# Patient Record
Sex: Male | Born: 1965 | Race: Black or African American | Hispanic: No | Smoking: Heavy tobacco smoker
Health system: Southern US, Community
[De-identification: ages and names within clinical notes are randomized; demographics above are authoritative.]

## PROBLEM LIST (undated history)

## (undated) ENCOUNTER — Encounter

## (undated) DIAGNOSIS — Z531 Procedure and treatment not carried out because of patient's decision for reasons of belief and group pressure: Secondary | ICD-10-CM

## (undated) DIAGNOSIS — I10 Essential (primary) hypertension: Secondary | ICD-10-CM

## (undated) DIAGNOSIS — W3400XA Accidental discharge from unspecified firearms or gun, initial encounter: Secondary | ICD-10-CM

## (undated) DIAGNOSIS — R42 Dizziness and giddiness: Secondary | ICD-10-CM

## (undated) DIAGNOSIS — IMO0001 Reserved for inherently not codable concepts without codable children: Secondary | ICD-10-CM

## (undated) DIAGNOSIS — E7800 Pure hypercholesterolemia, unspecified: Secondary | ICD-10-CM

## (undated) DIAGNOSIS — E119 Type 2 diabetes mellitus without complications: Secondary | ICD-10-CM

## (undated) HISTORY — PX: OTHER SURGICAL HISTORY: SHX169

---

## 2012-04-12 ENCOUNTER — Emergency Department (INDEPENDENT_AMBULATORY_CARE_PROVIDER_SITE_OTHER)
Admission: EM | Admit: 2012-04-12 | Discharge: 2012-04-12 | Disposition: A | Discharge status: Nursing Home (Includes ICF) | Payer: Self-pay | Source: Home / Self Care | Attending: Family Medicine | Admitting: Family Medicine

## 2012-04-12 ENCOUNTER — Encounter (HOSPITAL_COMMUNITY): Payer: Self-pay | Admitting: *Deleted

## 2012-04-12 ENCOUNTER — Emergency Department (HOSPITAL_COMMUNITY): Payer: Self-pay

## 2012-04-12 ENCOUNTER — Emergency Department (HOSPITAL_COMMUNITY)
Admission: EM | Admit: 2012-04-12 | Discharge: 2012-04-12 | Disposition: A | Discharge status: Home / Self Care | Payer: Self-pay | Attending: Emergency Medicine | Admitting: Emergency Medicine

## 2012-04-12 DIAGNOSIS — N498 Inflammatory disorders of other specified male genital organs: Secondary | ICD-10-CM | POA: Insufficient documentation

## 2012-04-12 DIAGNOSIS — F17200 Nicotine dependence, unspecified, uncomplicated: Secondary | ICD-10-CM

## 2012-04-12 DIAGNOSIS — R1030 Lower abdominal pain, unspecified: Secondary | ICD-10-CM

## 2012-04-12 DIAGNOSIS — R109 Unspecified abdominal pain: Secondary | ICD-10-CM

## 2012-04-12 DIAGNOSIS — Z862 Personal history of diseases of the blood and blood-forming organs and certain disorders involving the immune mechanism: Secondary | ICD-10-CM | POA: Insufficient documentation

## 2012-04-12 DIAGNOSIS — Z720 Tobacco use: Secondary | ICD-10-CM

## 2012-04-12 DIAGNOSIS — Z8639 Personal history of other endocrine, nutritional and metabolic disease: Secondary | ICD-10-CM | POA: Insufficient documentation

## 2012-04-12 DIAGNOSIS — Z87828 Personal history of other (healed) physical injury and trauma: Secondary | ICD-10-CM | POA: Insufficient documentation

## 2012-04-12 DIAGNOSIS — N492 Inflammatory disorders of scrotum: Secondary | ICD-10-CM

## 2012-04-12 DIAGNOSIS — E119 Type 2 diabetes mellitus without complications: Secondary | ICD-10-CM | POA: Insufficient documentation

## 2012-04-12 DIAGNOSIS — N509 Disorder of male genital organs, unspecified: Secondary | ICD-10-CM

## 2012-04-12 DIAGNOSIS — N5082 Scrotal pain: Secondary | ICD-10-CM

## 2012-04-12 DIAGNOSIS — I10 Essential (primary) hypertension: Secondary | ICD-10-CM | POA: Insufficient documentation

## 2012-04-12 HISTORY — DX: Pure hypercholesterolemia, unspecified: E78.00

## 2012-04-12 HISTORY — DX: Type 2 diabetes mellitus without complications: E11.9

## 2012-04-12 HISTORY — DX: Essential (primary) hypertension: I10

## 2012-04-12 HISTORY — DX: Accidental discharge from unspecified firearms or gun, initial encounter: W34.00XA

## 2012-04-12 LAB — GLUCOSE, CAPILLARY: Glucose-Capillary: 72 mg/dL (ref 70–99)

## 2012-04-12 MED ORDER — DIAZEPAM 5 MG PO TABS
5.0000 mg | ORAL_TABLET | Freq: Once | ORAL | Status: AC
  Administered 2012-04-12: 5 mg via ORAL
  Filled 2012-04-12: qty 1

## 2012-04-12 MED ORDER — OXYCODONE-ACETAMINOPHEN 5-325 MG PO TABS
1.0000 | ORAL_TABLET | Freq: Once | ORAL | Status: AC
  Administered 2012-04-12: 1 via ORAL
  Filled 2012-04-12: qty 1

## 2012-04-12 NOTE — ED Notes (Signed)
Patient complains of pain around scrotum after shaving pubic hair x 1 week; swelling and purulent drainage.

## 2012-04-12 NOTE — ED Notes (Addendum)
Pt sent here from [facility] for further eval on abscess to R scrotum.  Concern of the MD at UC is that swelling is inward.  No redness/warmth noted.
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
## 2012-04-12 NOTE — ED Provider Notes (Signed)
History     CSN: [id number]  Arrival date & time 04/12/12  1544   First MD Initiated Contact with Patient 04/12/12 1608     Chief Complaint  Patient presents with  . Groin Pain   The history is provided by the patient. No language interpreter was used.   Pt reports that he has been having an ingrown hair in the scrotum area for the last several days that has been getting worse.  He had been shaving his testicles and had an ingrown hair and now it is very painful.  Pt says that he has no fever or chills.  He denies nausea and vomiting.   He has a history of diabetes mellitus he had been on insulin.  He lost a lot of weight recently and ended up being off all oral and insulin medications for his diabetes.    Past Medical History  Diagnosis Date  . Diabetes mellitus without complication     History reviewed. No pertinent past surgical history.  No family history on file.  History  Substance Use Topics  . Smoking status: Heavy Tobacco Smoker -- 2.00 packs/day  . Smokeless tobacco: Not on file  . Alcohol Use: Yes     Comment: occasional    Review of Systems  Genitourinary: Positive for scrotal swelling and testicular pain.  All other systems reviewed and are negative.    Allergies  Review of patient's allergies indicates no known allergies.  Home Medications  No current outpatient prescriptions on file.  BP 130/75  Pulse 84  Temp(Src) 98.1 F (36.7 C) (Oral)  Resp 16  SpO2 100%  Physical Exam  Vitals reviewed. Constitutional: He is oriented to person, place, and time. He appears well-developed and well-nourished. No distress.  HENT:  Head: Normocephalic and atraumatic.  Eyes: Conjunctivae and EOM are normal. Pupils are equal, round, and reactive to light.  Neck: Normal range of motion. Neck supple.  Cardiovascular: Normal rate, regular rhythm and normal heart sounds.   Pulmonary/Chest: Effort normal and breath sounds normal.  Abdominal: Soft. Bowel sounds are  normal.  Genitourinary: Penis normal.    Right testis shows mass, swelling and tenderness.  Musculoskeletal: Normal range of motion.  Neurological: He is alert and oriented to person, place, and time.  Skin: Skin is warm and dry.  Psychiatric: He has a normal mood and affect. His behavior is normal. Judgment and thought content normal.    ED Course  Procedures (including critical care time)  Labs Reviewed - No data to display No results found.   No diagnosis found.   MDM  IMPRESSION  Diabetes Mellitus  Scrotal Abscess  RECOMMENDATIONS / PLAN The abscess is severely tender to palpation and manipulation.  Because patient is diabetic I am sending patient to the ER for further treatment and evaluation.    FOLLOW UP For postop care after treatments  The patient was given clear instructions to go to ER or return to medical center if symptoms don't improve, worsen or new problems develop.  The patient verbalized understanding.  The patient was told to call to get lab results if they haven't heard anything in the next week.    Results for orders placed during the hospital encounter of 04/12/12  GLUCOSE, CAPILLARY      Result Value Range   Glucose-Capillary 72  70 - 99 mg/dL          Cleora Fleet, MD 04/12/12 2011

## 2012-04-13 NOTE — ED Provider Notes (Signed)
I saw and evaluated the patient, reviewed the resident's note and I agree with the findings and plan. Patient with scrotal abscess that is small in nature and does not appear to involve internal structures. It started after shaving in the area. 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 Ultrasound was negative for underlying involvement and I&D performed at bedside  Gwyneth Sprout, MD 04/13/12 0031

## 2012-04-17 ENCOUNTER — Emergency Department (HOSPITAL_COMMUNITY)
Admission: EM | Admit: 2012-04-17 | Discharge: 2012-04-17 | Disposition: A | Discharge status: Home / Self Care | Payer: Self-pay | Attending: Emergency Medicine | Admitting: Emergency Medicine

## 2012-04-17 ENCOUNTER — Encounter (HOSPITAL_COMMUNITY): Payer: Self-pay | Admitting: *Deleted

## 2012-04-17 DIAGNOSIS — E119 Type 2 diabetes mellitus without complications: Secondary | ICD-10-CM | POA: Insufficient documentation

## 2012-04-17 DIAGNOSIS — N492 Inflammatory disorders of scrotum: Secondary | ICD-10-CM

## 2012-04-17 DIAGNOSIS — Z862 Personal history of diseases of the blood and blood-forming organs and certain disorders involving the immune mechanism: Secondary | ICD-10-CM | POA: Insufficient documentation

## 2012-04-17 DIAGNOSIS — Z79899 Other long term (current) drug therapy: Secondary | ICD-10-CM | POA: Insufficient documentation

## 2012-04-17 DIAGNOSIS — F17200 Nicotine dependence, unspecified, uncomplicated: Secondary | ICD-10-CM | POA: Insufficient documentation

## 2012-04-17 DIAGNOSIS — Z87828 Personal history of other (healed) physical injury and trauma: Secondary | ICD-10-CM | POA: Insufficient documentation

## 2012-04-17 DIAGNOSIS — N498 Inflammatory disorders of other specified male genital organs: Secondary | ICD-10-CM | POA: Insufficient documentation

## 2012-04-17 DIAGNOSIS — Z8639 Personal history of other endocrine, nutritional and metabolic disease: Secondary | ICD-10-CM | POA: Insufficient documentation

## 2012-04-17 DIAGNOSIS — I10 Essential (primary) hypertension: Secondary | ICD-10-CM | POA: Insufficient documentation

## 2012-04-17 MED ORDER — CEPHALEXIN 250 MG/5ML PO SUSR: 250.0000 mg | Freq: Four times a day (QID) | ORAL | Status: AC

## 2012-04-17 MED ORDER — HYDROCODONE-ACETAMINOPHEN 5-325 MG PO TABS
2.0000 | ORAL_TABLET | Freq: Once | ORAL | Status: AC
  Administered 2012-04-17: 2 via ORAL
  Filled 2012-04-17: qty 2

## 2012-04-17 MED ORDER — HYDROCODONE-ACETAMINOPHEN 5-325 MG PO TABS: 1.0000 | ORAL_TABLET | Freq: Four times a day (QID) | ORAL | Status: DC | PRN
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
## 2012-04-17 NOTE — ED Notes (Signed)
Pt discharged.Vital signs stable and GCS 15 

## 2012-04-17 NOTE — ED Notes (Signed)
Pt was tx here for scrotal wall abscess on 3/27 and told to come back for re-check in 4 days.  States wound continues to drain, but denies redness.

## 2012-04-18 NOTE — ED Provider Notes (Signed)
Medical screening examination/treatment/procedure(s) were conducted as a shared visit with non-physician practitioner(s) and myself.  I personally evaluated the patient during the encounter  Recurrent scrotal abscess. No testicular pain, fever, vomiting.  Glynn Octave, MD 04/18/12 0201

## 2016-10-29 ENCOUNTER — Emergency Department: Admit: 2016-10-29 | Discharge: 2016-10-29

## 2016-10-29 ENCOUNTER — Inpatient Hospital Stay: Admit: 2016-10-29 | Discharge: 2016-10-29

## 2016-10-29 DIAGNOSIS — E119 Type 2 diabetes mellitus without complications: Secondary | ICD-10-CM

## 2016-10-29 DIAGNOSIS — Z23 Encounter for immunization: Secondary | ICD-10-CM

## 2016-10-29 DIAGNOSIS — S0990XA Unspecified injury of head, initial encounter: Secondary | ICD-10-CM

## 2016-10-29 DIAGNOSIS — M542 Cervicalgia: Secondary | ICD-10-CM

## 2016-10-29 DIAGNOSIS — S8991XA Unspecified injury of right lower leg, initial encounter: Secondary | ICD-10-CM

## 2016-10-29 DIAGNOSIS — S00211A Abrasion of right eyelid and periocular area, initial encounter: Principal | ICD-10-CM

## 2016-10-29 DIAGNOSIS — F1010 Alcohol abuse, uncomplicated: Secondary | ICD-10-CM

## 2016-10-29 DIAGNOSIS — M25561 Pain in right knee: Secondary | ICD-10-CM

## 2016-10-29 MED ORDER — TETANUS-DIPHTH-ACELL PERTUSSIS 5-2-15.5 LF-MCG/0.5 IM SUSP
.5 mL | Freq: Once | INTRAMUSCULAR | Status: CP
Start: 2016-10-29 — End: ?

## 2016-10-29 NOTE — ED Provider Notes
1341 Physical examination, vital signs normal; abrasion to R eyebrow, covered w band-aid prior to EMS arrival; PERRL; no focal deficits; A&Ox4; head diffusely TTP, TTP over L neck soft tissue, TTP over R knee; no midline neck or back tenderness. CTAB; no MRG, RRR; DTRs intact    [MC]   1342 Will order labs, imaging; BG 111, reassuring  [MC]      ED Course User Index  [MC] Warren Danes Excell Seltzer, MD         MDM   Decide to obtain history from someone other than the patient: {SH ED Lamonte Sakai MDM - OBTAIN ZOXWRUE:45409}    Decide to obtain previous medical records: [facility] ED Lamonte Sakai MDM - PREVIOUS MED REC - NO WJX:91478}    Clinical Lab Test(s): {SH ED Lamonte Sakai MDM ORDERED AND REVIEWED:28124}    Diagnostic Tests (Radiology, EKG): {SH ED Lamonte Sakai MDM ORDERED AND REVIEWED:28124}    Independent Visualization (ED [place], Wet Prep, Other): {SH ED Lamonte Sakai MDM NO YES [id number]:86578}    Discussed patient with NON-ED Provider: {SH ED Lamonte Sakai MDM - ANOTHER PROVIDER:28381}      ED Disposition   ED Disposition: No ED Disposition Set      ED Clinical Impression   ED Clinical Impression:   No Clinical Impression Set      ED Patient Status   Patient Status:   {SH ED [facility] PATIENT STATUS:[phone]}        ED Medical Evaluation Initiated   Medical Evaluation Initiated:  Yes, filed at 10/29/16 1327  by Tawanna Solo, MD

## 2016-10-29 NOTE — ED Provider Notes
No murmur heard.  Pulmonary/Chest: Effort normal and breath sounds normal. No respiratory distress. He has no wheezes. He has no rales. He exhibits no tenderness.   Abdominal: Soft. Bowel sounds are normal. He exhibits no distension. There is no tenderness. There is no rebound and no guarding.   Musculoskeletal: Normal range of motion. He exhibits tenderness. He exhibits no edema or deformity.   Physical examination, vital signs normal; abrasion to R eyebrow, covered w band-aid prior to EMS arrival; PERRL; no focal deficits; A&Ox4; head diffusely TTP, TTP over L neck soft tissue, TTP over R knee; no midline neck or back tenderness. CTAB; no MRG, RRR; DTRs intact     Neurological: He is alert and oriented to person, place, and time. He exhibits normal muscle tone. Coordination normal.   Skin: Skin is warm and dry. No rash noted. He is not diaphoretic. No erythema. No pallor.   Psychiatric: He has a normal mood and affect. His behavior is normal.   Nursing note and vitals reviewed.      Differential DDx: Epidural hematoma, subdural hematoma, SAH, basilar skull fracture, others    Is this an Emergent Medical Condition? Yes - Severe Pain/Acute Onset of Symptons  409.901 FS  641.19 FS  627.732 (16) FS    ED Workup   Procedures    Labs:  -   BASIC METABOLIC PANEL - Abnormal        Result Value Ref Range    Sodium 142  135 - 145 mmol/L    Potassium 3.8  3.3 - 4.6 mmol/L    Chloride 105  101 - 110 mmol/L    CO2 21  21 - 29 mmol/L    Urea Nitrogen 6  6 - 22 mg/dL    Creatinine 0.85  0.67 - 1.17 mg/dL    BUN/Creatinine Ratio 7.1  6.0 - 22.0 (calc)    Glucose 118 (*) 71 - 99 mg/dL    Calcium 7.8 (*) 8.6 - 10.0 mg/dL    Osmolality Calc 281.8      Anion Gap 16  4 - 16 mmol/L    EGFR >59  mL/min/1.73M2    Comment:   Reference range: =>90 ml/min/1.73M2  eGFR estimates are unable to accurately differentiate levels of GFR above 60 ml/min/1.73M2.   ETHYL ALCOHOL - Abnormal     Ethyl Alcohol 314.0 (*) <=10.0 mg/dL

## 2016-10-29 NOTE — ED Notes
Patient to xray

## 2016-10-29 NOTE — ED Provider Notes
Fair        ED Medical Evaluation Initiated   Medical Evaluation Initiated:  Yes, filed at 10/29/16 1327  by Tawanna Solo, MD            Tawanna Solo, MD  Resident  10/29/16 [phone]

## 2016-10-29 NOTE — ED Provider Notes
?   Years of education: N/A     Social History Main Topics   ? Smoking status: Unknown If Ever Smoked   ? Smokeless tobacco: None   ? Alcohol use Yes   ? Drug use: Unknown   ? Sexual activity: Not Asked     Other Topics Concern   ? None     Social History Narrative   ? None       Review of Systems   Constitutional: Negative for fever, chills and decreased responsiveness.   HENT: Negative for hearing loss.    Eyes: Negative for visual disturbance.   Respiratory: Negative for cough and shortness of breath.    Cardiovascular: Negative for chest pain and palpitations.   Gastrointestinal: Negative for nausea, vomiting, abdominal pain, diarrhea and bowel incontinence.   Genitourinary: Negative for penile pain, testicular pain and pelvic pain.   Musculoskeletal: Negative for neck pain.   Neurological: Positive for headaches. Negative for dizziness, focal weakness, syncope, weakness and light-headedness.   Psychiatric/Behavioral: Negative for memory loss.   All other systems reviewed and are negative.      Physical Exam     ED Triage Vitals   BP 10/29/16 1321 122/82   Pulse 10/29/16 1338 89   Resp 10/29/16 1321 12   Temp --    Temp src --    Height --    Weight --    SpO2 10/29/16 1338 97 %   BMI (Calculated) --              Physical Exam   Constitutional: He is oriented to person, place, and time. He appears well-developed and well-nourished. No distress.   HENT:   Head: Normocephalic and atraumatic.   Right Ear: External ear normal.   Left Ear: External ear normal.   TMs normal, no evidence of hemotympanum   Eyes: Pupils are equal, round, and reactive to light. Conjunctivae and EOM are normal. Right eye exhibits no discharge. Left eye exhibits no discharge.   Neck: Normal range of motion. Neck supple. No JVD present. No tracheal deviation present. No thyromegaly present.   Cardiovascular: Normal rate, regular rhythm, normal heart sounds and intact distal pulses.  Exam reveals no gallop and no friction rub.

## 2016-10-29 NOTE — ED Provider Notes
Alcohol g/dL 9.604  g//dL   OSMOLALITY BLOOD - Abnormal     Osmolality 380 (*) 274 - 298 mosm/Kg   CBC AUTODIFF - Abnormal     WBC 8.64  4.5 - 11 x10E3/uL    RBC 5.51  4.50 - 6.30 x10E6/uL    Hemoglobin 17.0  14.0 - 18.0 g/dL    Hematocrit 54.0  98.1 - 54.0 %    MCV 88.6  82.0 - 101.0 fl    MCH 30.9  27.0 - 34.0 pg    MCHC 34.8  31.0 - 36.0 g/dL    RDW 19.1  47.8 - 29.5 %    Platelet Count 310  140 - 440 thou/cu mm    MPV 8.9 (*) 9.5 - 11.5 fl    nRBC % 0.0  0.0 - 1.0 %    Absolute NRBC Count 0.00      Neutrophils % 61.5  34.0 - 73.0 %    Lymphocytes % 25.8  25.0 - 45.0 %    Monocytes % 10.1 (*) 2.0 - 6.0 %    Eosinophils % 2.1  1.0 - 4.0 %    Immature Granulocytes % 0.3  0.0 - 2.0 %    Neutrophils Absolute 5.31  1.80 - 8.70 x10E3/uL    Lymphocytes Absolute 2.23  x10E3/uL    Monocytes Absolute 0.87  x10E3/uL    Eosinophils Absolute 0.18  x10E3/uL    Basophil Absolute 0.02  x10E3/uL    Absolute Immature Granulocytes 0.03 (*) 0 - 0 x10E3/uL    Basophils % 0.2  0 - 1 %   POCT GLUCOSE - Abnormal     Glucose (Meter) 111 (*) 60.0 - 99.0 mg/dL   RAINBOW DRAW   PURPLE TOP TUBE    HOLD RAINBOW SPECIMEN Hold     BLUE TOP TUBE    HOLD RAINBOW SPECIMEN Hold     LIGHT GREEN TOP TUBE    HOLD RAINBOW SPECIMEN Hold     POCT GLUCOSE   CBC AND DIFFERENTIAL         Imaging   XR Knee Right 4  Views         CT Head w/o Con   Preliminary Result      No acute intracranial hemorrhage, mass-effect or territorial infarct.      Soft tissue swelling noted over the right frontotemporal bone without underlying fracture.      Paranasal sinus disease.      XR Chest Single View   Final Result   Impression:      No acute cardiopulmonary disease.      Read By Burr Medico M.D.     Electronically Verified By - Burr Medico M.D.     Released Date Time - 10/29/2016 2:58 PM     Resident -               EKG (Read by ED Provider):  not applicable        ED Course & Re-Evaluation     ED Course as of Oct 30 1706   Sat Oct 29, 2016

## 2016-10-29 NOTE — ED Notes
Patient back to CT.

## 2016-10-29 NOTE — ED Provider Notes
Pulmonary/Chest: Effort normal and breath sounds normal. No respiratory distress. He has no wheezes. He has no rales. He exhibits no tenderness.   Abdominal: Soft. Bowel sounds are normal. He exhibits no distension. There is no tenderness. There is no rebound and no guarding.   Musculoskeletal: Normal range of motion. He exhibits tenderness. He exhibits no edema or deformity.   Physical examination, vital signs normal; abrasion to R eyebrow, covered w band-aid prior to EMS arrival; PERRL; no focal deficits; A&Ox4; head diffusely TTP, TTP over L neck soft tissue, TTP over R knee; no midline neck or back tenderness. CTAB; no MRG, RRR; DTRs intact     Neurological: He is alert and oriented to person, place, and time. He exhibits normal muscle tone. Coordination normal.   Skin: Skin is warm and dry. No rash noted. He is not diaphoretic. No erythema. No pallor.   Psychiatric: He has a normal mood and affect. His behavior is normal.   Nursing note and vitals reviewed.      Differential DDx: Epidural hematoma, subdural hematoma, SAH, basilar skull fracture, others    Is this an Emergent Medical Condition? Yes - Severe Pain/Acute Onset of Symptons  409.901 FS  641.19 FS  627.732 (16) FS    ED Workup   Procedures    Labs:  -   BASIC METABOLIC PANEL - Abnormal        Result Value Ref Range    Sodium 142  135 - 145 mmol/L    Potassium 3.8  3.3 - 4.6 mmol/L    Chloride 105  101 - 110 mmol/L    CO2 21  21 - 29 mmol/L    Urea Nitrogen 6  6 - 22 mg/dL    Creatinine 0.85  0.67 - 1.17 mg/dL    BUN/Creatinine Ratio 7.1  6.0 - 22.0 (calc)    Glucose 118 (*) 71 - 99 mg/dL    Calcium 7.8 (*) 8.6 - 10.0 mg/dL    Osmolality Calc 281.8      Anion Gap 16  4 - 16 mmol/L    EGFR >59  mL/min/1.73M2    Comment:   Reference range: =>90 ml/min/1.73M2  eGFR estimates are unable to accurately differentiate levels of GFR above 60 ml/min/1.73M2.   ETHYL ALCOHOL - Abnormal     Ethyl Alcohol 314.0 (*) <=10.0 mg/dL    Comment: Phoned IT trainer

## 2016-10-29 NOTE — ED Provider Notes
Comment: Phoned Tax inspector    Alcohol g/dL 1.610  g//dL   OSMOLALITY BLOOD - Abnormal     Osmolality 380 (*) 274 - 298 mosm/Kg   CBC AUTODIFF - Abnormal     WBC 8.64  4.5 - 11 x10E3/uL    RBC 5.51  4.50 - 6.30 x10E6/uL    Hemoglobin 17.0  14.0 - 18.0 g/dL    Hematocrit 96.0  45.4 - 54.0 %    MCV 88.6  82.0 - 101.0 fl    MCH 30.9  27.0 - 34.0 pg    MCHC 34.8  31.0 - 36.0 g/dL    RDW 09.8  11.9 - 14.7 %    Platelet Count 310  140 - 440 thou/cu mm    MPV 8.9 (*) 9.5 - 11.5 fl    nRBC % 0.0  0.0 - 1.0 %    Absolute NRBC Count 0.00      Neutrophils % 61.5  34.0 - 73.0 %    Lymphocytes % 25.8  25.0 - 45.0 %    Monocytes % 10.1 (*) 2.0 - 6.0 %    Eosinophils % 2.1  1.0 - 4.0 %    Immature Granulocytes % 0.3  0.0 - 2.0 %    Neutrophils Absolute 5.31  1.80 - 8.70 x10E3/uL    Lymphocytes Absolute 2.23  x10E3/uL    Monocytes Absolute 0.87  x10E3/uL    Eosinophils Absolute 0.18  x10E3/uL    Basophil Absolute 0.02  x10E3/uL    Absolute Immature Granulocytes 0.03 (*) 0 - 0 x10E3/uL    Basophils % 0.2  0 - 1 %   POCT GLUCOSE - Abnormal     Glucose (Meter) 111 (*) 60.0 - 99.0 mg/dL   RAINBOW DRAW   PURPLE TOP TUBE    HOLD RAINBOW SPECIMEN Hold     BLUE TOP TUBE    HOLD RAINBOW SPECIMEN Hold     LIGHT GREEN TOP TUBE    HOLD RAINBOW SPECIMEN Hold     POCT GLUCOSE   CBC AND DIFFERENTIAL         Imaging   XR Knee Right 4  Views   Final Result      CT Head w/o Con   Preliminary Result      No acute intracranial hemorrhage, mass-effect or territorial infarct.      Soft tissue swelling noted over the right frontotemporal bone without underlying fracture.      Paranasal sinus disease.      XR Chest Single View   Final Result   Impression:      No acute cardiopulmonary disease.      Read By Burr Medico M.D.     Electronically Verified By - Burr Medico M.D.     Released Date Time - 10/29/2016 2:58 PM     Resident -               EKG (Read by ED Provider):  not applicable        ED Course & Re-Evaluation
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
## 2016-10-29 NOTE — ED Notes
Patient back from CT.

## 2016-10-29 NOTE — ED Notes
IV removed with catheter intact.  Pressure applied, Time of discharge: 1715  PM., Patient signed out AMA to  Home.  Patient ambulatory to exit with belongings in stable condition.  Patient escorted by his wife. Patient left prior to receiving discharge paperwork.

## 2016-10-29 NOTE — ED Provider Notes
Social History Main Topics   ? Smoking status: Unknown If Ever Smoked   ? Smokeless tobacco: None   ? Alcohol use Yes   ? Drug use: Unknown   ? Sexual activity: Not Asked     Other Topics Concern   ? None     Social History Narrative   ? None       Review of Systems   Constitutional: Negative for fever, chills and decreased responsiveness.   HENT: Negative for hearing loss.    Eyes: Negative for visual disturbance.   Respiratory: Negative for cough and shortness of breath.    Cardiovascular: Negative for chest pain and palpitations.   Gastrointestinal: Negative for nausea, vomiting, abdominal pain, diarrhea and bowel incontinence.   Genitourinary: Negative for penile pain, testicular pain and pelvic pain.   Musculoskeletal: Negative for neck pain.   Neurological: Positive for headaches. Negative for dizziness, focal weakness, syncope, weakness and light-headedness.   Psychiatric/Behavioral: Negative for memory loss.   All other systems reviewed and are negative.      Physical Exam     ED Triage Vitals   BP 10/29/16 1321 122/82   Pulse 10/29/16 1338 89   Resp 10/29/16 1321 12   Temp --    Temp src --    Height --    Weight --    SpO2 10/29/16 1338 97 %   BMI (Calculated) --              Physical Exam   Constitutional: He is oriented to person, place, and time. He appears well-developed and well-nourished. No distress.   HENT:   Head: Normocephalic and atraumatic.   Right Ear: External ear normal.   Left Ear: External ear normal.   TMs normal, no evidence of hemotympanum   Eyes: Pupils are equal, round, and reactive to light. Conjunctivae and EOM are normal. Right eye exhibits no discharge. Left eye exhibits no discharge.   Neck: Normal range of motion. Neck supple. No JVD present. No tracheal deviation present. No thyromegaly present.   Cardiovascular: Normal rate, regular rhythm, normal heart sounds and intact distal pulses.  Exam reveals no gallop and no friction rub.    No murmur heard.
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
## 2016-10-29 NOTE — ED Notes
MD at bedside.
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
## 2016-10-29 NOTE — ED Notes
Patient back from xray. Patient and his wife are aware that we are waiting on test results at this time and they have no unanswered questions.

## 2016-10-29 NOTE — ED Notes
Patient's wife signed him out Against Medical Advice.
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
## 2016-10-29 NOTE — ED Provider Notes
Medical Evaluation Initiated:  Yes, filed at 10/29/16 1327  by Tawanna Solo, MD

## 2016-10-29 NOTE — ED Notes
Patient's wife arrived at bedside.
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
## 2016-10-29 NOTE — ED Notes
Patient requested to see MD for update on plan of care. MD notified.

## 2016-10-29 NOTE — ED Provider Notes
Social History Main Topics   ? Smoking status: Unknown If Ever Smoked   ? Smokeless tobacco: None   ? Alcohol use Yes   ? Drug use: Unknown   ? Sexual activity: Not Asked     Other Topics Concern   ? None     Social History Narrative   ? None       Review of Systems   Constitutional: Negative for fever, chills and decreased responsiveness.   HENT: Negative for hearing loss.    Eyes: Negative for visual disturbance.   Respiratory: Negative for cough and shortness of breath.    Cardiovascular: Negative for chest pain and palpitations.   Gastrointestinal: Negative for nausea, vomiting, abdominal pain, diarrhea and bowel incontinence.   Genitourinary: Negative for penile pain, testicular pain and pelvic pain.   Musculoskeletal: Negative for neck pain.   Neurological: Positive for headaches. Negative for dizziness, focal weakness, syncope, weakness and light-headedness.   Psychiatric/Behavioral: Negative for memory loss.   All other systems reviewed and are negative.      Physical Exam     ED Triage Vitals [10/29/16 1321]   BP 122/82   Pulse    Resp 12   Temp    Temp src    Height    Weight    SpO2    BMI (Calculated)              Physical Exam   Constitutional: He is oriented to person, place, and time. He appears well-developed and well-nourished. No distress.   HENT:   Head: Normocephalic and atraumatic.   Right Ear: External ear normal.   Left Ear: External ear normal.   TMs normal, no evidence of hemotympanum   Eyes: Pupils are equal, round, and reactive to light. Conjunctivae and EOM are normal. Right eye exhibits no discharge. Left eye exhibits no discharge.   Neck: Normal range of motion. Neck supple. No JVD present. No tracheal deviation present. No thyromegaly present.   Cardiovascular: Normal rate, regular rhythm, normal heart sounds and intact distal pulses.  Exam reveals no gallop and no friction rub.    No murmur heard.  Pulmonary/Chest: Effort normal and breath sounds normal. No respiratory
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
## 2016-10-29 NOTE — ED Notes
Patient to CT
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
## 2016-10-29 NOTE — ED Notes
Patient back from CT

## 2017-02-09 ENCOUNTER — Emergency Department (HOSPITAL_COMMUNITY)
Admission: EM | Admit: 2017-02-09 | Discharge: 2017-02-10 | Disposition: A | Discharge status: Home / Self Care | Payer: Self-pay | Attending: Emergency Medicine | Admitting: Emergency Medicine

## 2017-02-09 ENCOUNTER — Other Ambulatory Visit: Payer: Self-pay

## 2017-02-09 ENCOUNTER — Encounter (HOSPITAL_COMMUNITY): Payer: Self-pay

## 2017-02-09 DIAGNOSIS — Z5321 Procedure and treatment not carried out due to patient leaving prior to being seen by health care provider: Secondary | ICD-10-CM | POA: Insufficient documentation

## 2017-02-09 DIAGNOSIS — R1110 Vomiting, unspecified: Secondary | ICD-10-CM | POA: Insufficient documentation

## 2017-02-09 HISTORY — DX: Reserved for inherently not codable concepts without codable children: IMO0001

## 2017-02-09 HISTORY — DX: Procedure and treatment not carried out because of patient's decision for reasons of belief and group pressure: Z53.1

## 2017-02-09 LAB — COMPREHENSIVE METABOLIC PANEL
ALK PHOS: 66 U/L (ref 38–126)
ALT: 40 U/L (ref 17–63)
AST: 41 U/L (ref 15–41)
Albumin: 4 g/dL (ref 3.5–5.0)
Anion gap: 13 (ref 5–15)
BILIRUBIN TOTAL: 0.6 mg/dL (ref 0.3–1.2)
BUN: 10 mg/dL (ref 6–20)
CO2: 23 mmol/L (ref 22–32)
CREATININE: 0.85 mg/dL (ref 0.61–1.24)
Calcium: 9.1 mg/dL (ref 8.9–10.3)
Chloride: 101 mmol/L (ref 101–111)
GFR calc Af Amer: >60 mL/min (ref >60)
Glucose, Bld: 115 mg/dL — ABNORMAL HIGH (ref 65–99)
Potassium: 3.7 mmol/L (ref 3.5–5.1)
Sodium: 137 mmol/L (ref 135–145)
TOTAL PROTEIN: 7.4 g/dL (ref 6.5–8.1)

## 2017-02-09 LAB — URINALYSIS, ROUTINE W REFLEX MICROSCOPIC
Bilirubin Urine: NEGATIVE
Glucose, UA: NEGATIVE mg/dL
HGB URINE DIPSTICK: NEGATIVE
Ketones, ur: NEGATIVE mg/dL
NITRITE: NEGATIVE
PROTEIN: 30 mg/dL — AB
Specific Gravity, Urine: 1.021 (ref 1.005–1.030)
pH: 9 — ABNORMAL HIGH (ref 5.0–8.0)

## 2017-02-09 LAB — LIPASE, BLOOD: Lipase: 32 U/L (ref 11–51)

## 2017-02-09 LAB — CBC
HEMATOCRIT: 43.7 % (ref 39.0–52.0)
Hemoglobin: 15.1 g/dL (ref 13.0–17.0)
MCH: 30.8 pg (ref 26.0–34.0)
MCHC: 34.6 g/dL (ref 30.0–36.0)
MCV: 89 fL (ref 78.0–100.0)
Platelets: 321 10*3/uL (ref 150–400)
RBC: 4.91 MIL/uL (ref 4.22–5.81)
RDW: 13.9 % (ref 11.5–15.5)
WBC: 7.2 10*3/uL (ref 4.0–10.5)

## 2017-02-09 NOTE — ED Triage Notes (Signed)
Pt states he has generalized body aches, headache, vomiting X3 days. Pt alert and oriented, skin warm and dry. No distress noted.

## 2017-02-09 NOTE — ED Notes (Signed)
Lab work, radiology results and vital signs reviewed, no critical results at this time, no change in acuity indicated.  

## 2017-02-10 NOTE — ED Notes (Signed)
Follow-up call completed.  Pt. s wife state, "He is feeling better today."  Very grateful for the follow-up

## 2017-02-11 ENCOUNTER — Other Ambulatory Visit: Payer: Self-pay

## 2017-02-11 ENCOUNTER — Emergency Department (HOSPITAL_COMMUNITY)
Admission: EM | Admit: 2017-02-11 | Discharge: 2017-02-11 | Discharge status: Against Medical Advice | Payer: Self-pay | Attending: Emergency Medicine | Admitting: Emergency Medicine

## 2017-02-11 ENCOUNTER — Encounter (HOSPITAL_COMMUNITY): Payer: Self-pay | Admitting: Emergency Medicine

## 2017-02-11 DIAGNOSIS — R51 Headache: Secondary | ICD-10-CM | POA: Insufficient documentation

## 2017-02-11 DIAGNOSIS — Z5321 Procedure and treatment not carried out due to patient leaving prior to being seen by health care provider: Secondary | ICD-10-CM | POA: Insufficient documentation

## 2017-02-11 NOTE — ED Notes (Signed)
Patient called x 2 with no answer 

## 2017-02-11 NOTE — ED Notes (Signed)
Called 3X -no response

## 2017-02-11 NOTE — ED Triage Notes (Signed)
Pt states he was seen in ED yesterday and left post-triage due to wait.  Reports generalized body aches, headache, vomiting, and dizziness.  States his wife was admitted 3 days last week due to flu.

## 2017-03-07 ENCOUNTER — Encounter (HOSPITAL_COMMUNITY): Payer: Self-pay | Admitting: Emergency Medicine

## 2017-03-07 ENCOUNTER — Emergency Department (HOSPITAL_COMMUNITY): Payer: Self-pay

## 2017-03-07 ENCOUNTER — Other Ambulatory Visit: Payer: Self-pay

## 2017-03-07 ENCOUNTER — Emergency Department (HOSPITAL_COMMUNITY)
Admission: EM | Admit: 2017-03-07 | Discharge: 2017-03-07 | Disposition: A | Discharge status: Home / Self Care | Payer: Self-pay | Attending: Emergency Medicine | Admitting: Emergency Medicine

## 2017-03-07 DIAGNOSIS — R05 Cough: Secondary | ICD-10-CM | POA: Insufficient documentation

## 2017-03-07 DIAGNOSIS — Z5321 Procedure and treatment not carried out due to patient leaving prior to being seen by health care provider: Secondary | ICD-10-CM | POA: Insufficient documentation

## 2017-03-07 DIAGNOSIS — R42 Dizziness and giddiness: Secondary | ICD-10-CM | POA: Insufficient documentation

## 2017-03-07 DIAGNOSIS — R51 Headache: Secondary | ICD-10-CM | POA: Insufficient documentation

## 2017-03-07 NOTE — ED Notes (Signed)
Called pt for vitals no answer 
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
## 2017-03-07 NOTE — ED Notes (Signed)
Called pt no answer °

## 2017-03-10 ENCOUNTER — Encounter (HOSPITAL_COMMUNITY): Payer: Self-pay

## 2017-03-10 ENCOUNTER — Emergency Department (HOSPITAL_COMMUNITY)
Admission: EM | Admit: 2017-03-10 | Discharge: 2017-03-10 | Disposition: A | Discharge status: Home / Self Care | Payer: Self-pay | Attending: Emergency Medicine | Admitting: Emergency Medicine

## 2017-03-10 DIAGNOSIS — E119 Type 2 diabetes mellitus without complications: Secondary | ICD-10-CM | POA: Insufficient documentation

## 2017-03-10 DIAGNOSIS — Z79899 Other long term (current) drug therapy: Secondary | ICD-10-CM | POA: Insufficient documentation

## 2017-03-10 DIAGNOSIS — J111 Influenza due to unidentified influenza virus with other respiratory manifestations: Secondary | ICD-10-CM | POA: Insufficient documentation

## 2017-03-10 DIAGNOSIS — I10 Essential (primary) hypertension: Secondary | ICD-10-CM | POA: Insufficient documentation

## 2017-03-10 DIAGNOSIS — R69 Illness, unspecified: Secondary | ICD-10-CM

## 2017-03-10 DIAGNOSIS — F17200 Nicotine dependence, unspecified, uncomplicated: Secondary | ICD-10-CM | POA: Insufficient documentation

## 2017-03-10 HISTORY — DX: Dizziness and giddiness: R42

## 2017-03-10 LAB — INFLUENZA PANEL BY PCR (TYPE A & B)
INFLAPCR: POSITIVE — AB
Influenza B By PCR: NEGATIVE

## 2017-03-10 MED ORDER — IBUPROFEN 800 MG PO TABS
800.0000 mg | ORAL_TABLET | Freq: Once | ORAL | Status: AC
  Administered 2017-03-10: 800 mg via ORAL
  Filled 2017-03-10: qty 1

## 2017-03-10 MED ORDER — OSELTAMIVIR PHOSPHATE 75 MG PO CAPS: 75.0000 mg | ORAL_CAPSULE | Freq: Two times a day (BID) | ORAL | 0 refills | Status: DC

## 2017-03-10 MED ORDER — IBUPROFEN 600 MG PO TABS: 600.0000 mg | ORAL_TABLET | Freq: Four times a day (QID) | ORAL | 0 refills | Status: DC | PRN

## 2017-03-10 MED ORDER — OSELTAMIVIR PHOSPHATE 75 MG PO CAPS
75.0000 mg | ORAL_CAPSULE | Freq: Once | ORAL | Status: AC
  Administered 2017-03-10: 75 mg via ORAL
  Filled 2017-03-10: qty 1
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
## 2017-03-12 ENCOUNTER — Emergency Department (HOSPITAL_COMMUNITY)
Admission: EM | Admit: 2017-03-12 | Discharge: 2017-03-12 | Disposition: A | Discharge status: Home / Self Care | Payer: Self-pay | Attending: Emergency Medicine | Admitting: Emergency Medicine

## 2017-03-12 ENCOUNTER — Encounter (HOSPITAL_COMMUNITY): Payer: Self-pay | Admitting: Student

## 2017-03-12 DIAGNOSIS — E119 Type 2 diabetes mellitus without complications: Secondary | ICD-10-CM | POA: Insufficient documentation

## 2017-03-12 DIAGNOSIS — I10 Essential (primary) hypertension: Secondary | ICD-10-CM | POA: Insufficient documentation

## 2017-03-12 DIAGNOSIS — R42 Dizziness and giddiness: Secondary | ICD-10-CM | POA: Insufficient documentation

## 2017-03-12 DIAGNOSIS — Z79899 Other long term (current) drug therapy: Secondary | ICD-10-CM | POA: Insufficient documentation

## 2017-03-12 DIAGNOSIS — F17200 Nicotine dependence, unspecified, uncomplicated: Secondary | ICD-10-CM | POA: Insufficient documentation

## 2017-03-12 MED ORDER — MECLIZINE HCL 25 MG PO TABS: 25.0000 mg | ORAL_TABLET | Freq: Three times a day (TID) | ORAL | 0 refills | Status: DC | PRN

## 2017-03-12 NOTE — Discharge Instructions (Signed)
You were seen in the emergency department today for your vertigo.  Your physical exam was normal.  We have given you a refill of meclizine, you may take this once every 8 hours as needed for dizziness.  We have also given you a handout for the Epley maneuver, you may perform this when you are having your spells of dizziness.  We recommend that you follow-up with a primary care provider in the next week for reevaluation and further management of your symptoms.  If you do not have a primary care provider you may follow-up with our [place] [facility] or you may call the phone number in your discharge instructions to set up primary care.  Return to the emergency department for any new or worsening symptoms or any other concerns you may have.

## 2017-03-12 NOTE — ED Triage Notes (Signed)
Pt to ER for check up after being diagnosed with flu Friday, states feels 100% better but wants a check up.  Also states he is experiencing his vertigo intermittently over the last 4 months, states only happens when he leans to the left. Pt denies having vertigo at this time. Pt has no complaints.
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
## 2019-05-23 ENCOUNTER — Emergency Department: Admit: 2019-05-23 | Discharge: 2019-05-23

## 2019-05-23 DIAGNOSIS — M5431 Sciatica, right side: Principal | ICD-10-CM

## 2019-05-23 MED ORDER — MELOXICAM 7.5 MG PO TABS
7.5 mg | Freq: Every day | ORAL | 0 refills | Status: CP
Start: 2019-05-23 — End: ?

## 2019-05-23 MED ORDER — KETOROLAC TROMETHAMINE 30 MG/ML IJ SOLN
60 mg | Freq: Once | INTRAMUSCULAR | Status: CP
Start: 2019-05-23 — End: ?

## 2019-05-23 MED ORDER — GABAPENTIN 300 MG PO CAPS
300 mg | Freq: Three times a day (TID) | ORAL | 0 refills | 30.00000 days | Status: CP
Start: 2019-05-23 — End: ?

## 2019-08-12 IMAGING — DX DG CHEST 2V
2 series · 2 of 2 positions shown · non-contrast
Comparison: None.

CLINICAL DATA: Cough and shortness of breath. Cough for months,
worsening over the past 2 weeks. Smoker.

EXAM:
CHEST  2 VIEW

[chest pa]
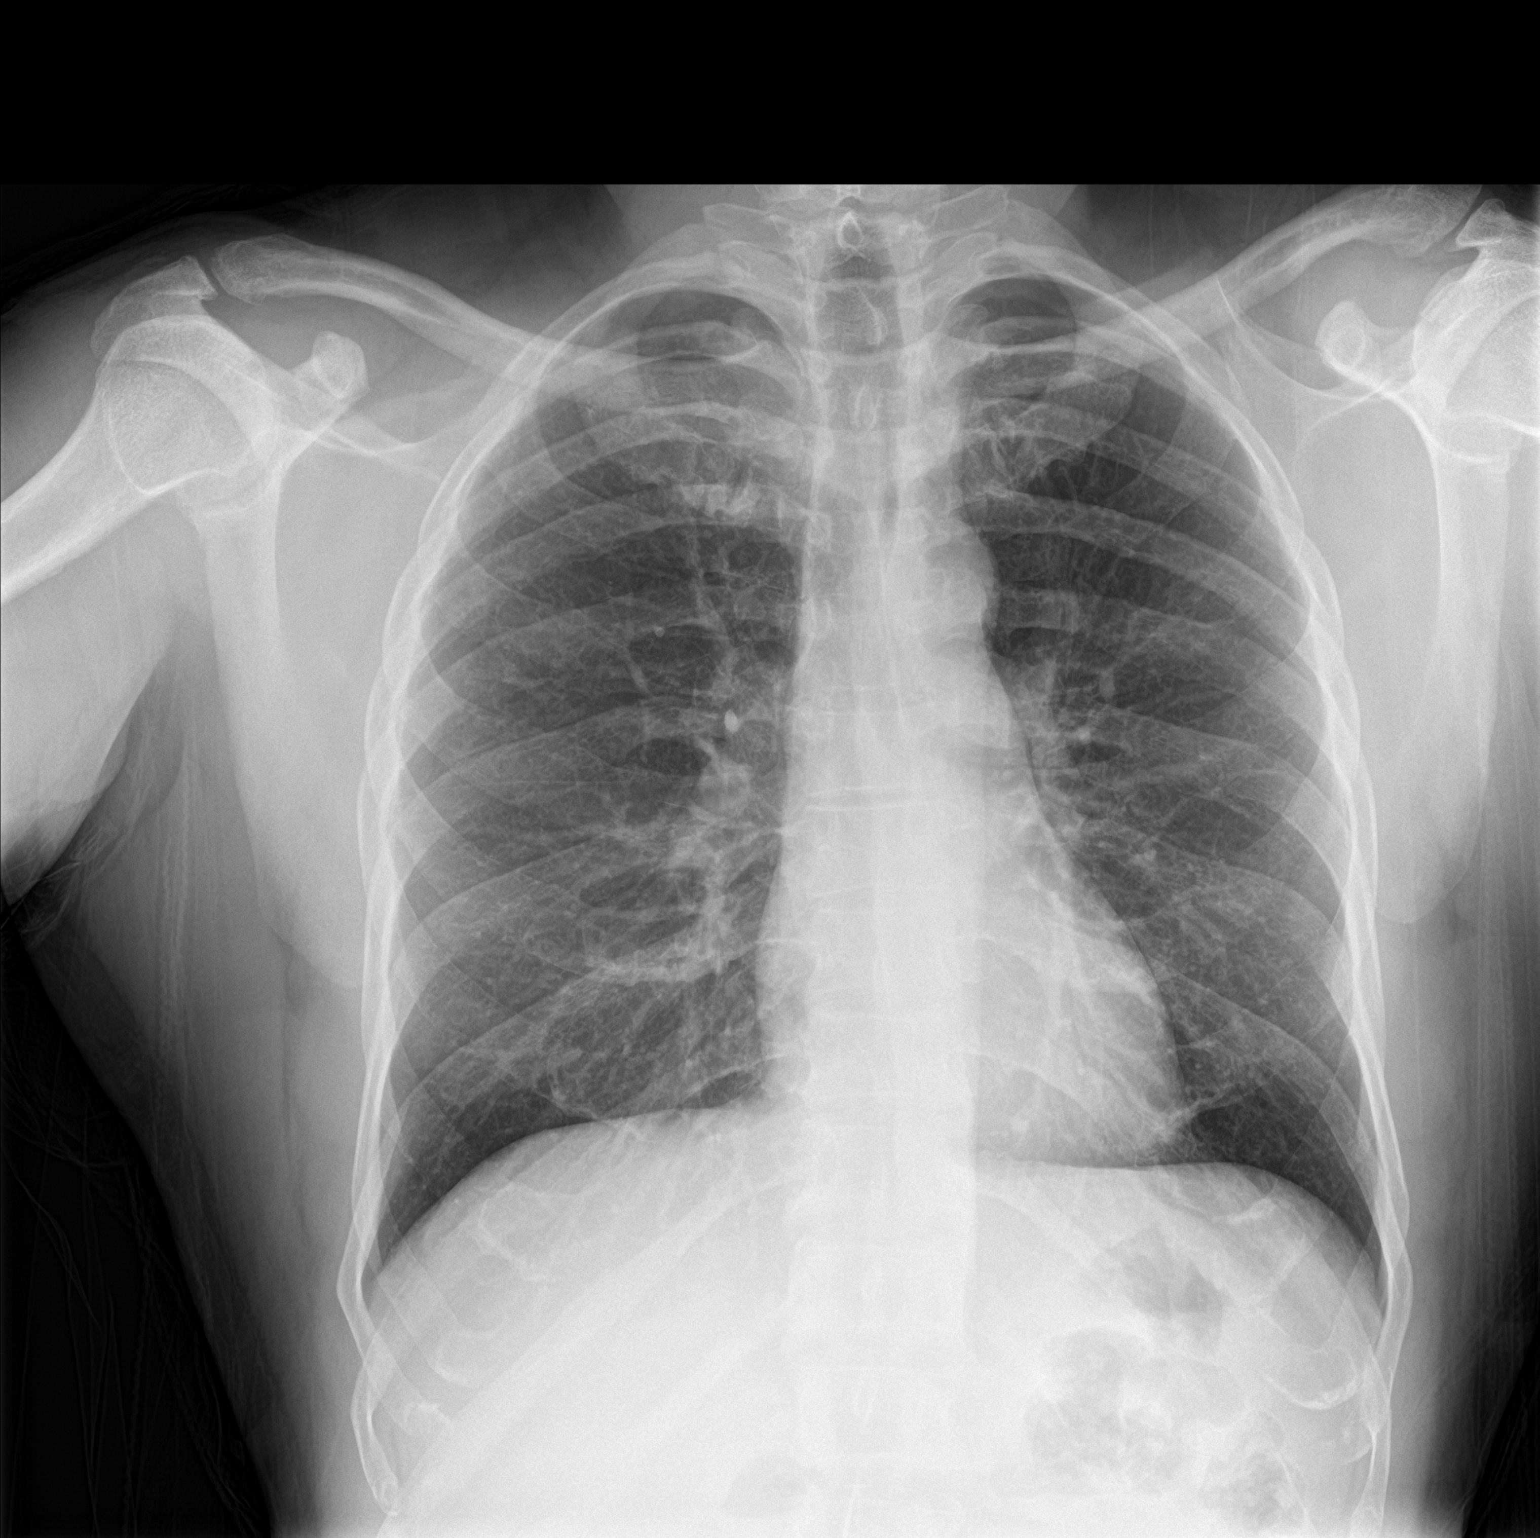

[chest lat]
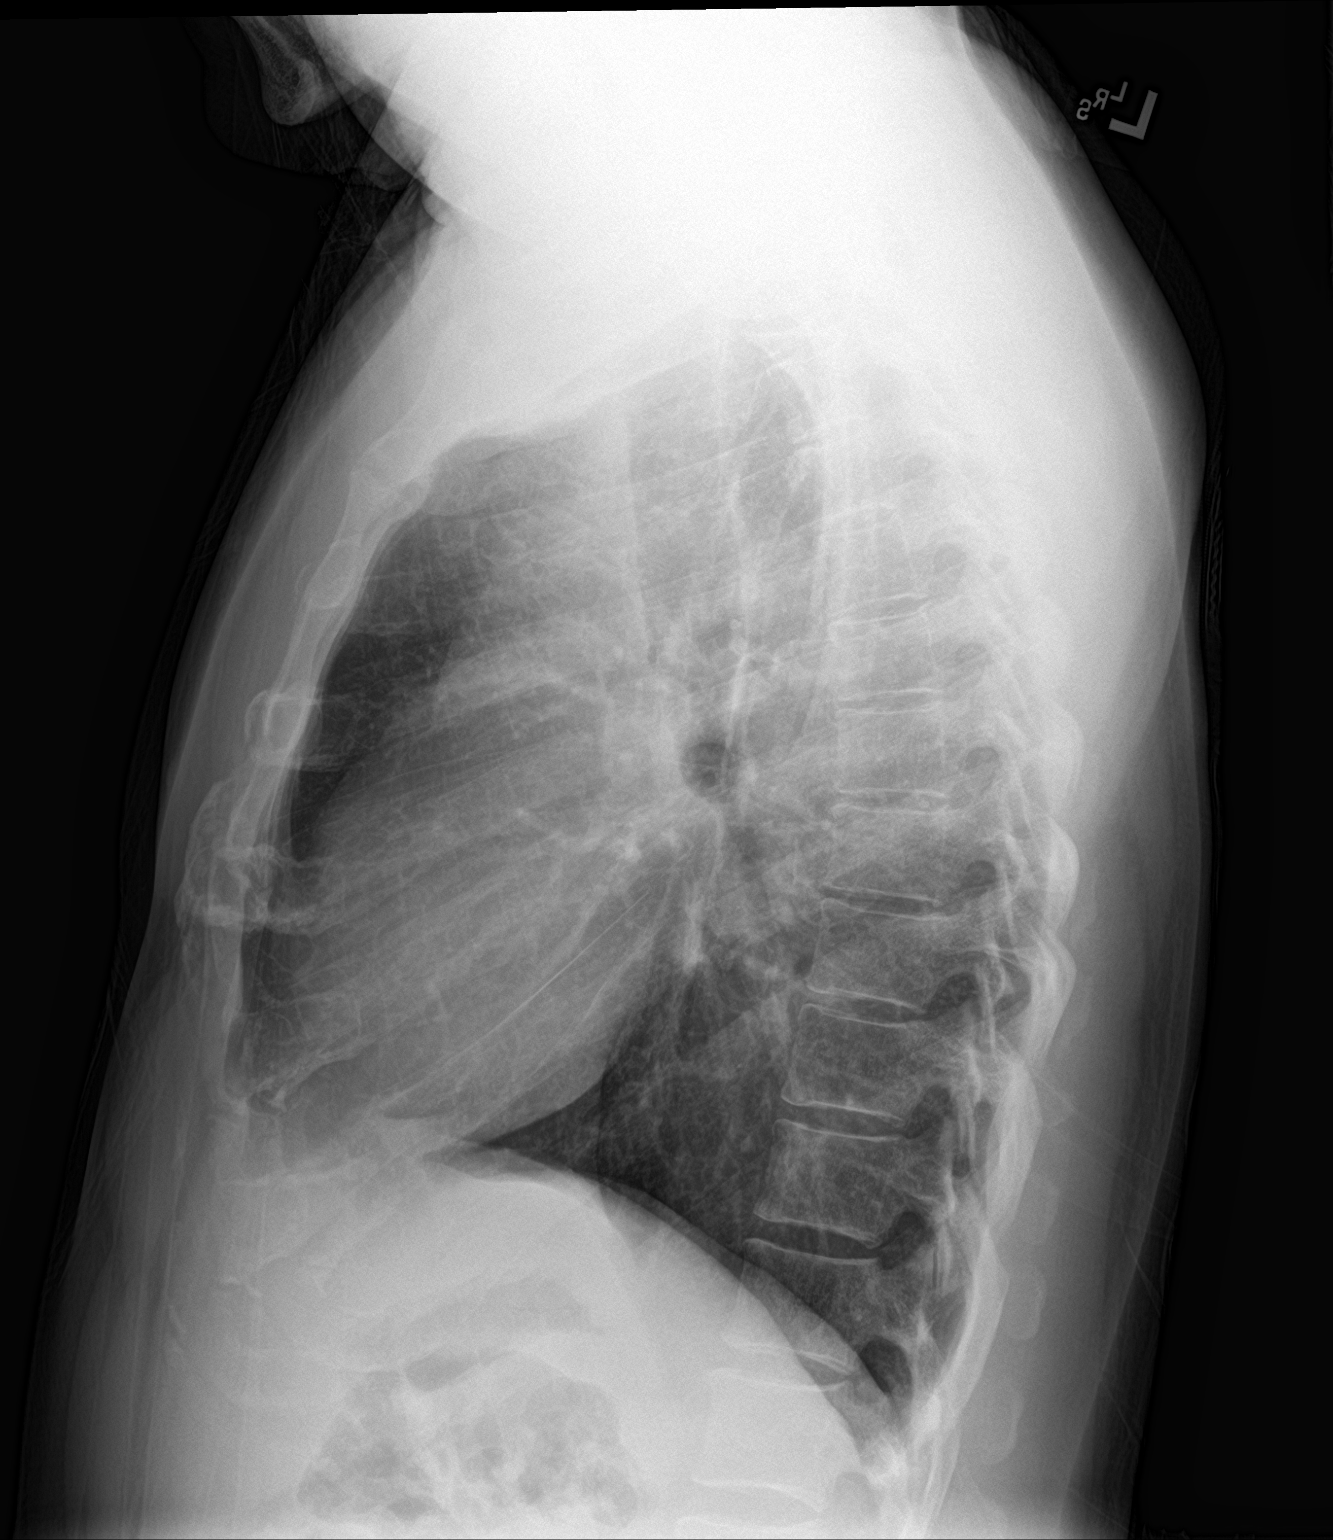

[2 of 2 positions shown; findings below may reference images not displayed]

FINDINGS: The cardiomediastinal contours are normal. Borderline hyperinflation
with mild bronchitic change. Pulmonary vasculature is normal. No
consolidation, pleural effusion, or pneumothorax. No acute osseous
abnormalities are seen.
IMPRESSION: Mild bronchitic change with borderline hyperinflation, favor smoking
related lung disease. Asthma or bronchitis could have a similar
appearance.

## 2019-12-10 ENCOUNTER — Encounter (HOSPITAL_COMMUNITY): Payer: Self-pay | Admitting: Emergency Medicine

## 2019-12-10 ENCOUNTER — Emergency Department (HOSPITAL_COMMUNITY)
Admission: EM | Admit: 2019-12-10 | Discharge: 2019-12-11 | Disposition: A | Discharge status: Home / Self Care | Payer: Self-pay | Attending: Emergency Medicine | Admitting: Emergency Medicine

## 2019-12-10 ENCOUNTER — Other Ambulatory Visit: Payer: Self-pay

## 2019-12-10 DIAGNOSIS — R112 Nausea with vomiting, unspecified: Secondary | ICD-10-CM | POA: Insufficient documentation

## 2019-12-10 DIAGNOSIS — I10 Essential (primary) hypertension: Secondary | ICD-10-CM | POA: Insufficient documentation

## 2019-12-10 DIAGNOSIS — Z79899 Other long term (current) drug therapy: Secondary | ICD-10-CM | POA: Insufficient documentation

## 2019-12-10 DIAGNOSIS — M549 Dorsalgia, unspecified: Secondary | ICD-10-CM | POA: Insufficient documentation

## 2019-12-10 DIAGNOSIS — Z20822 Contact with and (suspected) exposure to covid-19: Secondary | ICD-10-CM | POA: Insufficient documentation

## 2019-12-10 DIAGNOSIS — F17210 Nicotine dependence, cigarettes, uncomplicated: Secondary | ICD-10-CM | POA: Insufficient documentation

## 2019-12-10 DIAGNOSIS — E119 Type 2 diabetes mellitus without complications: Secondary | ICD-10-CM | POA: Insufficient documentation

## 2019-12-10 LAB — CBC WITH DIFFERENTIAL/PLATELET
Abs Immature Granulocytes: 0.01 10*3/uL (ref 0.00–0.07)
Basophils Absolute: 0 10*3/uL (ref 0.0–0.1)
Basophils Relative: 0 %
Eosinophils Absolute: 0.2 10*3/uL (ref 0.0–0.5)
Eosinophils Relative: 4 %
HCT: 41.8 % (ref 39.0–52.0)
Hemoglobin: 14.3 g/dL (ref 13.0–17.0)
Immature Granulocytes: 0 %
Lymphocytes Relative: 25 %
Lymphs Abs: 1.3 10*3/uL (ref 0.7–4.0)
MCH: 31.3 pg (ref 26.0–34.0)
MCHC: 34.2 g/dL (ref 30.0–36.0)
MCV: 91.5 fL (ref 80.0–100.0)
Monocytes Absolute: 0.6 10*3/uL (ref 0.1–1.0)
Monocytes Relative: 11 %
Neutro Abs: 3.1 10*3/uL (ref 1.7–7.7)
Neutrophils Relative %: 60 %
Platelets: 300 10*3/uL (ref 150–400)
RBC: 4.57 MIL/uL (ref 4.22–5.81)
RDW: 15.1 % (ref 11.5–15.5)
WBC: 5.2 10*3/uL (ref 4.0–10.5)
nRBC: 0 % (ref 0.0–0.2)

## 2019-12-10 LAB — LIPASE, BLOOD: Lipase: 32 U/L (ref 11–51)

## 2019-12-10 LAB — URINALYSIS, ROUTINE W REFLEX MICROSCOPIC
Bilirubin Urine: NEGATIVE
Glucose, UA: 50 mg/dL — AB
Hgb urine dipstick: NEGATIVE
Ketones, ur: 20 mg/dL — AB
Nitrite: NEGATIVE
Protein, ur: 30 mg/dL — AB
Specific Gravity, Urine: 1.025 (ref 1.005–1.030)
pH: 6 (ref 5.0–8.0)

## 2019-12-10 LAB — RESPIRATORY PANEL BY RT PCR (FLU A&B, COVID)
Influenza A by PCR: NEGATIVE
Influenza B by PCR: NEGATIVE
SARS Coronavirus 2 by RT PCR: NEGATIVE

## 2019-12-10 LAB — COMPREHENSIVE METABOLIC PANEL
ALT: 67 U/L — ABNORMAL HIGH (ref 0–44)
AST: 73 U/L — ABNORMAL HIGH (ref 15–41)
Albumin: 3.8 g/dL (ref 3.5–5.0)
Alkaline Phosphatase: 58 U/L (ref 38–126)
Anion gap: 10 (ref 5–15)
BUN: 14 mg/dL (ref 6–20)
CO2: 24 mmol/L (ref 22–32)
Calcium: 9.1 mg/dL (ref 8.9–10.3)
Chloride: 100 mmol/L (ref 98–111)
Creatinine, Ser: 1.02 mg/dL (ref 0.61–1.24)
GFR, Estimated: >60 mL/min (ref >60)
Glucose, Bld: 137 mg/dL — ABNORMAL HIGH (ref 70–99)
Potassium: 3.6 mmol/L (ref 3.5–5.1)
Sodium: 134 mmol/L — ABNORMAL LOW (ref 135–145)
Total Bilirubin: 1.2 mg/dL (ref 0.3–1.2)
Total Protein: 6.5 g/dL (ref 6.5–8.1)

## 2019-12-10 MED ORDER — ONDANSETRON HCL 4 MG/2ML IJ SOLN
4.0000 mg | Freq: Once | INTRAMUSCULAR | Status: AC
  Administered 2019-12-10: 4 mg via INTRAVENOUS
  Filled 2019-12-10: qty 2

## 2019-12-10 MED ORDER — LACTATED RINGERS IV BOLUS
1000.0000 mL | Freq: Once | INTRAVENOUS | Status: AC
  Administered 2019-12-10: 1000 mL via INTRAVENOUS

## 2019-12-10 NOTE — ED Notes (Signed)
Pt given a cup of ginger ale for Po challenge
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
## 2019-12-10 NOTE — ED Triage Notes (Signed)
Patient reports headache with right low back pain for 2 days , denies injury , patient added mild chills .

## 2019-12-11 NOTE — ED Notes (Signed)
Pt tolerated PO challenge well, denies nausea, no active vomiting. Reports abd pain 3/10, burt expressing improvement to pain from previous rating.

## 2019-12-11 NOTE — Discharge Instructions (Addendum)
Keep drinking fluids and get some rest.  Return if you start having severe abdominal pain or vomiting becomes worse.  COVID test today was neg.  Normal labs except for mild irritation of your liver which is probably from your alcohol use but your pancreas is normal and kidney's are normal.

## 2019-12-18 ENCOUNTER — Encounter: Payer: Self-pay | Admitting: Critical Care Medicine

## 2019-12-18 NOTE — Progress Notes (Signed)
Patient ID: Anthony Webster, male   DOB: 1965-12-21, 54 y.o.   MRN: [id number] This is a 54 year old male history of severe alcohol use diabetes type 2 hypertension  The patient is out of all his medications he formally was on Metformin and an antihypertensive.  He has been drinking actively beer and hard liquor on an average day 8 drinks.  When he stops drinking even for 1 day he becomes very shaky and has withdrawal symptoms.  He was in the emergency room a week ago with intractable nausea vomiting received a liter of lactated Ringer's and antiemetics and sent home  Tonight he comes into the shelter clinic expressing a desire he wants to detox and get off alcohol he has just a new resident tonight to the shelter along with his wife who I also saw the day  On exam blood pressure 120/70 pulse 88 saturation 99% room air chest was clear abdomen nontender cardiac exam unremarkable   impression is that of severe alcoholism and need to detox #1 #2 type 2 diabetes but I was not able obtain a glucose in this patient tonight because he is so dry he was not able to reduce blood with a fingerstick #2 hypertension not currently needing treatment patient appears to be volume depleted   Plan for this is for the patient to be referred for alcohol detox to the [facility] regional emergency room the shelter director will manage this  Once he returns to the shelter in a detox state we will get him connected to services and we will follow-up and make further assessments as to his status of his diabetes

## 2019-12-29 ENCOUNTER — Emergency Department (HOSPITAL_COMMUNITY): Payer: Self-pay

## 2019-12-29 ENCOUNTER — Encounter (HOSPITAL_COMMUNITY): Payer: Self-pay | Admitting: Emergency Medicine

## 2019-12-29 ENCOUNTER — Emergency Department (HOSPITAL_COMMUNITY)
Admission: EM | Admit: 2019-12-29 | Discharge: 2019-12-29 | Disposition: A | Discharge status: Home / Self Care | Payer: Self-pay | Attending: Emergency Medicine | Admitting: Emergency Medicine

## 2019-12-29 ENCOUNTER — Other Ambulatory Visit: Payer: Self-pay

## 2019-12-29 DIAGNOSIS — R1013 Epigastric pain: Secondary | ICD-10-CM | POA: Insufficient documentation

## 2019-12-29 DIAGNOSIS — R42 Dizziness and giddiness: Secondary | ICD-10-CM | POA: Insufficient documentation

## 2019-12-29 DIAGNOSIS — R112 Nausea with vomiting, unspecified: Secondary | ICD-10-CM | POA: Insufficient documentation

## 2019-12-29 DIAGNOSIS — F17200 Nicotine dependence, unspecified, uncomplicated: Secondary | ICD-10-CM | POA: Insufficient documentation

## 2019-12-29 DIAGNOSIS — R059 Cough, unspecified: Secondary | ICD-10-CM | POA: Insufficient documentation

## 2019-12-29 DIAGNOSIS — Z20822 Contact with and (suspected) exposure to covid-19: Secondary | ICD-10-CM | POA: Insufficient documentation

## 2019-12-29 DIAGNOSIS — I10 Essential (primary) hypertension: Secondary | ICD-10-CM | POA: Insufficient documentation

## 2019-12-29 DIAGNOSIS — F10129 Alcohol abuse with intoxication, unspecified: Secondary | ICD-10-CM | POA: Insufficient documentation

## 2019-12-29 DIAGNOSIS — E119 Type 2 diabetes mellitus without complications: Secondary | ICD-10-CM | POA: Insufficient documentation

## 2019-12-29 DIAGNOSIS — Z789 Other specified health status: Secondary | ICD-10-CM

## 2019-12-29 LAB — COMPREHENSIVE METABOLIC PANEL
ALT: 41 U/L (ref 0–44)
AST: 48 U/L — ABNORMAL HIGH (ref 15–41)
Albumin: 4.9 g/dL (ref 3.5–5.0)
Alkaline Phosphatase: 64 U/L (ref 38–126)
Anion gap: 14 (ref 5–15)
BUN: 16 mg/dL (ref 6–20)
CO2: 23 mmol/L (ref 22–32)
Calcium: 9.4 mg/dL (ref 8.9–10.3)
Chloride: 101 mmol/L (ref 98–111)
Creatinine, Ser: 0.87 mg/dL (ref 0.61–1.24)
GFR, Estimated: >60 mL/min (ref >60)
Glucose, Bld: 87 mg/dL (ref 70–99)
Potassium: 4.4 mmol/L (ref 3.5–5.1)
Sodium: 138 mmol/L (ref 135–145)
Total Bilirubin: 1.3 mg/dL — ABNORMAL HIGH (ref 0.3–1.2)
Total Protein: 8.4 g/dL — ABNORMAL HIGH (ref 6.5–8.1)

## 2019-12-29 LAB — CBC WITH DIFFERENTIAL/PLATELET
Abs Immature Granulocytes: 0.01 10*3/uL (ref 0.00–0.07)
Basophils Absolute: 0 10*3/uL (ref 0.0–0.1)
Basophils Relative: 1 %
Eosinophils Absolute: 0.1 10*3/uL (ref 0.0–0.5)
Eosinophils Relative: 2 %
HCT: 48 % (ref 39.0–52.0)
Hemoglobin: 16.3 g/dL (ref 13.0–17.0)
Immature Granulocytes: 0 %
Lymphocytes Relative: 24 %
Lymphs Abs: 1.6 10*3/uL (ref 0.7–4.0)
MCH: 31.2 pg (ref 26.0–34.0)
MCHC: 34 g/dL (ref 30.0–36.0)
MCV: 91.8 fL (ref 80.0–100.0)
Monocytes Absolute: 0.8 10*3/uL (ref 0.1–1.0)
Monocytes Relative: 12 %
Neutro Abs: 4 10*3/uL (ref 1.7–7.7)
Neutrophils Relative %: 61 %
Platelets: 333 10*3/uL (ref 150–400)
RBC: 5.23 MIL/uL (ref 4.22–5.81)
RDW: 15.1 % (ref 11.5–15.5)
WBC: 6.6 10*3/uL (ref 4.0–10.5)
nRBC: 0 % (ref 0.0–0.2)

## 2019-12-29 LAB — RESP PANEL BY RT-PCR (FLU A&B, COVID) ARPGX2
Influenza A by PCR: NEGATIVE
Influenza B by PCR: NEGATIVE
SARS Coronavirus 2 by RT PCR: NEGATIVE

## 2019-12-29 LAB — LIPASE, BLOOD: Lipase: 17 U/L (ref 11–51)

## 2019-12-29 MED ORDER — BENZONATATE 100 MG PO CAPS: 100.0000 mg | ORAL_CAPSULE | Freq: Three times a day (TID) | ORAL | 0 refills | Status: DC

## 2019-12-29 MED ORDER — ONDANSETRON HCL 4 MG PO TABS: 4.0000 mg | ORAL_TABLET | Freq: Four times a day (QID) | ORAL | 0 refills | Status: DC

## 2019-12-29 MED ORDER — MECLIZINE HCL 12.5 MG PO TABS: 12.5000 mg | ORAL_TABLET | Freq: Three times a day (TID) | ORAL | 0 refills | Status: DC | PRN

## 2019-12-29 MED ORDER — ONDANSETRON 4 MG PO TBDP
4.0000 mg | ORAL_TABLET | Freq: Once | ORAL | Status: AC
  Administered 2019-12-29: 16:00:00 4 mg via ORAL
  Filled 2019-12-29: qty 1

## 2019-12-29 NOTE — Discharge Instructions (Addendum)
Take tessalon for cough and zofran for vomiting. Take meclizine for dizziness.  Please follow up with your primary care provider within 5-7 days for re-evaluation of your symptoms. If you do not have a primary care provider, information for a healthcare clinic has been provided for you to make arrangements for follow up care. Please return to the emergency department for any new or worsening symptoms.

## 2019-12-29 NOTE — ED Triage Notes (Signed)
Pt reports vertigo for past 2 days. Hx of it when living in [place]. Reports that yesterday was drinking ETOH and passed out last night and had chills. Adds having back pains-hx L4-L5 pinched.
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
## 2020-01-01 ENCOUNTER — Other Ambulatory Visit: Payer: Self-pay | Admitting: Critical Care Medicine

## 2020-01-01 ENCOUNTER — Encounter: Payer: Self-pay | Admitting: Critical Care Medicine

## 2020-01-01 ENCOUNTER — Other Ambulatory Visit (HOSPITAL_COMMUNITY): Payer: Self-pay | Admitting: Critical Care Medicine

## 2020-01-01 MED ORDER — MECLIZINE HCL 12.5 MG PO TABS: 12.5000 mg | ORAL_TABLET | Freq: Three times a day (TID) | ORAL | 0 refills | Status: AC | PRN

## 2020-01-01 MED ORDER — BENZONATATE 100 MG PO CAPS: 100.0000 mg | ORAL_CAPSULE | Freq: Three times a day (TID) | ORAL | 0 refills | Status: AC

## 2020-01-01 MED ORDER — THIAMINE HCL 100 MG PO TABS: 100.0000 mg | ORAL_TABLET | Freq: Every day | ORAL | 0 refills | Status: AC

## 2020-01-01 MED ORDER — ONDANSETRON HCL 4 MG PO TABS: 4.0000 mg | ORAL_TABLET | Freq: Four times a day (QID) | ORAL | 0 refills | Status: AC

## 2020-01-01 MED ORDER — FOLIC ACID 1 MG PO TABS: 1.0000 mg | ORAL_TABLET | Freq: Every day | ORAL | 2 refills | Status: AC

## 2020-01-01 MED FILL — MECLIZINE HCL 12.5 MG TABS: 12.5 | 30 days supply | Qty: 100 | Fill #0

## 2020-01-01 MED FILL — BENZONATATE 100 MG CAPS: 100 | 5 days supply | Qty: 15 | Fill #0

## 2020-01-01 MED FILL — ONDANSETRON HCL 4 MG TABLET: 4 | 3 days supply | Qty: 12 | Fill #0

## 2020-01-01 MED FILL — FOLIC ACID 1 MG TABS: 1 | 30 days supply | Qty: 30 | Fill #0

## 2020-01-01 NOTE — Progress Notes (Unsigned)
Patient ID: Anthony Webster, male   DOB: 1965/04/16, 54 y.o.   MRN: [id number] This is a 54 year old male seen in the [facility] clinic.  The patient had been seen 3 weeks ago then left the shelter and now has returned.  The patient had severe alcohol abuse he went to the emergency room 2 days ago underwent detox and discharged with Antivert and Zofran which he is not been able to fill as of yet.  The patient does have a cough productive of white mucus.  He is not drinking alcohol currently.  He is not on any vitamin supplementation.  He is committed to trying to completely quit alcohol use.  On exam blood pressure 130/96 pulse 94 saturation 96% room air the exam is unremarkable  Impression is that of alcohol abuse #1 #2 chronic bronchitis with tobacco use  Plan will be to obtain albuterol multivitamins thiamine and folic acid for this patient will also get his meclizine and Zofran filled through the [facility] find  No other plan will be to have this patient brought into the clinic for further examination and lab follow-up

## 2020-01-02 ENCOUNTER — Telehealth: Payer: Self-pay

## 2020-01-02 MED FILL — VITAMIN B-1 100 MG TABS: 100 | 30 days supply | Qty: 100 | Fill #0

## 2020-01-02 NOTE — Telephone Encounter (Signed)
ATC pt per Dr. Lynelle Doctor request to schedule appt to establish care, pt asking to come in same time as wife who has appt to establish care on 12/22, per Dr. Delford Field okay to double-book at 10am that day or schedule for another day if pt unable to schedule for that time.

## 2020-01-03 NOTE — Telephone Encounter (Signed)
ATC pt again for scheduling at all phone #s listed in chart, no answer, LMx2 for pt to [facility] to schedule

## 2020-01-06 ENCOUNTER — Telehealth: Payer: Self-pay

## 2020-01-06 NOTE — Telephone Encounter (Signed)
-----   Message from Tama High, RN sent at 01/06/2020  8:46 AM EST ----- Regarding: Appointment Contact: [phone] Hi Anthony Webster- I'm filling in at [facility]. DR Delford Field said you can get this patient on his schedule for Wednesday 12/22. The patient's wife has an 1100- arrive by 1030. Can we get Anthony Webster scheduled so they can ride share?  Thanks!

## 2020-01-06 NOTE — Telephone Encounter (Signed)
Per [place] she has spoken w/ pt's wife and made aware of need to arrive at 10a, also aware that he may not be able to be seen if he shows up late, appt scheduled in 10am slot to est care per Dr. Delford Field

## 2020-01-06 NOTE — Telephone Encounter (Signed)
ATC pt once again to try and schedule an appt w/ Dr. Delford Field for the morning of 12/22, 1st # listed gives busy signal, called it x2, 2nd phone # does not have VM available to LM, wife's phone # gave "call cannot be completed at this time" message

## 2020-01-06 NOTE — Telephone Encounter (Signed)
-----   Message from Tama High, RN sent at 01/06/2020 11:14 AM EST ----- Regarding: RE: Appointment Contact: [phone] His wife is going to call me. Is the appointment at 10 or does he just need to be there by 10?  ----- Message ----- From: Eloise Levels, RN Sent: 01/06/2020  11:11 AM EST To: Tama High, RN Subject: RE: Appointment                                I'm at [facility] today so you can call [phone], if I'm unavailable for any reason, or if it's easier, Dr. Delford Field basically asked me to have him arrive by or at 10am so if you are able to speak with him you can let him know this and let me know so I can place him on the schedule. If he cannot arrive by 10am then we'll have to look for another day since Dr. Delford Field is already double-booked for 11am that day. Thanks ----- Message ----- From: Tama High, RN Sent: 01/06/2020  10:48 AM EST To: Eloise Levels, RN Subject: RE: Appointment                                Do you have a number I can connect with a 3 way call? I sent a message to the shelter director to see if I can get through to the client on the shelter phone. ----- Message ----- From: Eloise Levels, RN Sent: 01/06/2020   9:32 AM EST To: Tama High, RN Subject: RE: Appointment                                Yes, Dr. Delford Field informed me of this last week but, unfortunately, I have not been able to get in contact with the pt at the phone #s listed in his chart. The 1st gives a busy signal, 2nd has no VM available and wife's goes straight to VM, I have left several messages without a RC from the pt and attempted to call again this morning and received a "call cannot be completed at this time" message on wife's # listed. I have documented all contact attempts. Thanks  ----- Message ----- From: Tama High, RN Sent: 01/06/2020   8:48 AM EST To: Eloise Levels, RN Subject: Appointment                                    Hi  Anthony Webster- I'm filling in at [facility] shelter. DR Delford Field said you can get this patient on his schedule for Wednesday 12/22. The patient's wife has an 1100- arrive by 1030. Can we get [name] scheduled so they can ride share?  Thanks!

## 2020-01-08 ENCOUNTER — Ambulatory Visit: Payer: Self-pay | Admitting: Critical Care Medicine

## 2020-01-08 ENCOUNTER — Encounter: Payer: Self-pay | Admitting: *Deleted
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
## 2020-01-08 NOTE — Progress Notes (Deleted)
   Subjective:    Patient ID: Anthony Webster, male    DOB: 05-11-1965, 54 y.o.   MRN: [id number]  54 y.o.M homeless patient from [facility] here to est PCP  01/08/2020 At last shelter clinic visit: This is a 54 year old male seen in the [place] shelter clinic.  The patient had been seen 3 weeks ago then left the shelter and now has returned.  The patient had severe alcohol abuse he went to the emergency room 2 days ago underwent detox and discharged with Antivert and Zofran which he is not been able to fill as of yet.  The patient does have a cough productive of white mucus.  He is not drinking alcohol currently.  He is not on any vitamin supplementation.  He is committed to trying to completely quit alcohol use.  On exam blood pressure 130/96 pulse 94 saturation 96% room air the exam is unremarkable  Impression is that of alcohol abuse #1 #2 chronic bronchitis with tobacco use  Plan will be to obtain albuterol multivitamins thiamine and folic acid for this patient will also get his meclizine and Zofran filled through the Congregational nurse find  No other plan will be to have this patient brought into the clinic for further examination and lab follow-up       Review of Systems     Objective:   Physical Exam        Assessment & Plan:

## 2020-01-16 ENCOUNTER — Encounter (HOSPITAL_COMMUNITY): Payer: Self-pay | Admitting: Emergency Medicine

## 2020-01-16 DIAGNOSIS — R569 Unspecified convulsions: Secondary | ICD-10-CM | POA: Insufficient documentation

## 2020-01-16 DIAGNOSIS — Z5321 Procedure and treatment not carried out due to patient leaving prior to being seen by health care provider: Secondary | ICD-10-CM | POA: Insufficient documentation

## 2020-01-16 NOTE — ED Triage Notes (Signed)
[facility] here from [facility] with complaints of full body shaking. ETOH - unknown. Hx of same.

## 2020-01-17 ENCOUNTER — Emergency Department (HOSPITAL_COMMUNITY)
Admission: EM | Admit: 2020-01-17 | Discharge: 2020-01-17 | Disposition: A | Discharge status: Home / Self Care | Payer: Self-pay | Attending: Emergency Medicine | Admitting: Emergency Medicine

## 2020-01-17 NOTE — ED Triage Notes (Signed)
Patient called to triage no answer. 

## 2020-01-24 ENCOUNTER — Encounter: Payer: Self-pay | Admitting: Critical Care Medicine

## 2021-01-04 ENCOUNTER — Emergency Department: Admit: 2021-01-04 | Discharge: 2021-01-04

## 2021-01-04 ENCOUNTER — Inpatient Hospital Stay: Admit: 2021-01-04 | Discharge: 2021-01-04

## 2021-01-04 DIAGNOSIS — M549 Dorsalgia, unspecified: Secondary | ICD-10-CM

## 2021-01-04 DIAGNOSIS — J069 Acute upper respiratory infection, unspecified: Principal | ICD-10-CM

## 2021-01-04 DIAGNOSIS — G8929 Other chronic pain: Secondary | ICD-10-CM

## 2021-01-04 DIAGNOSIS — E119 Type 2 diabetes mellitus without complications: Principal | ICD-10-CM

## 2021-01-04 DIAGNOSIS — M25551 Pain in right hip: Secondary | ICD-10-CM

## 2021-01-04 MED ORDER — HYDROCODONE-ACETAMINOPHEN 5-325 MG PO TABS
1 | ORAL_TABLET | Freq: Once | ORAL | Status: CP
Start: 2021-01-04 — End: ?

## 2022-06-04 IMAGING — DX DG CHEST 1V PORT
1 series · 1 of 1 positions shown · non-contrast
Comparison: Radiograph 03/07/2017

CLINICAL DATA: Cough, vertigo and chills for 2 days

EXAM:
PORTABLE CHEST 1 VIEW

[chest ap]
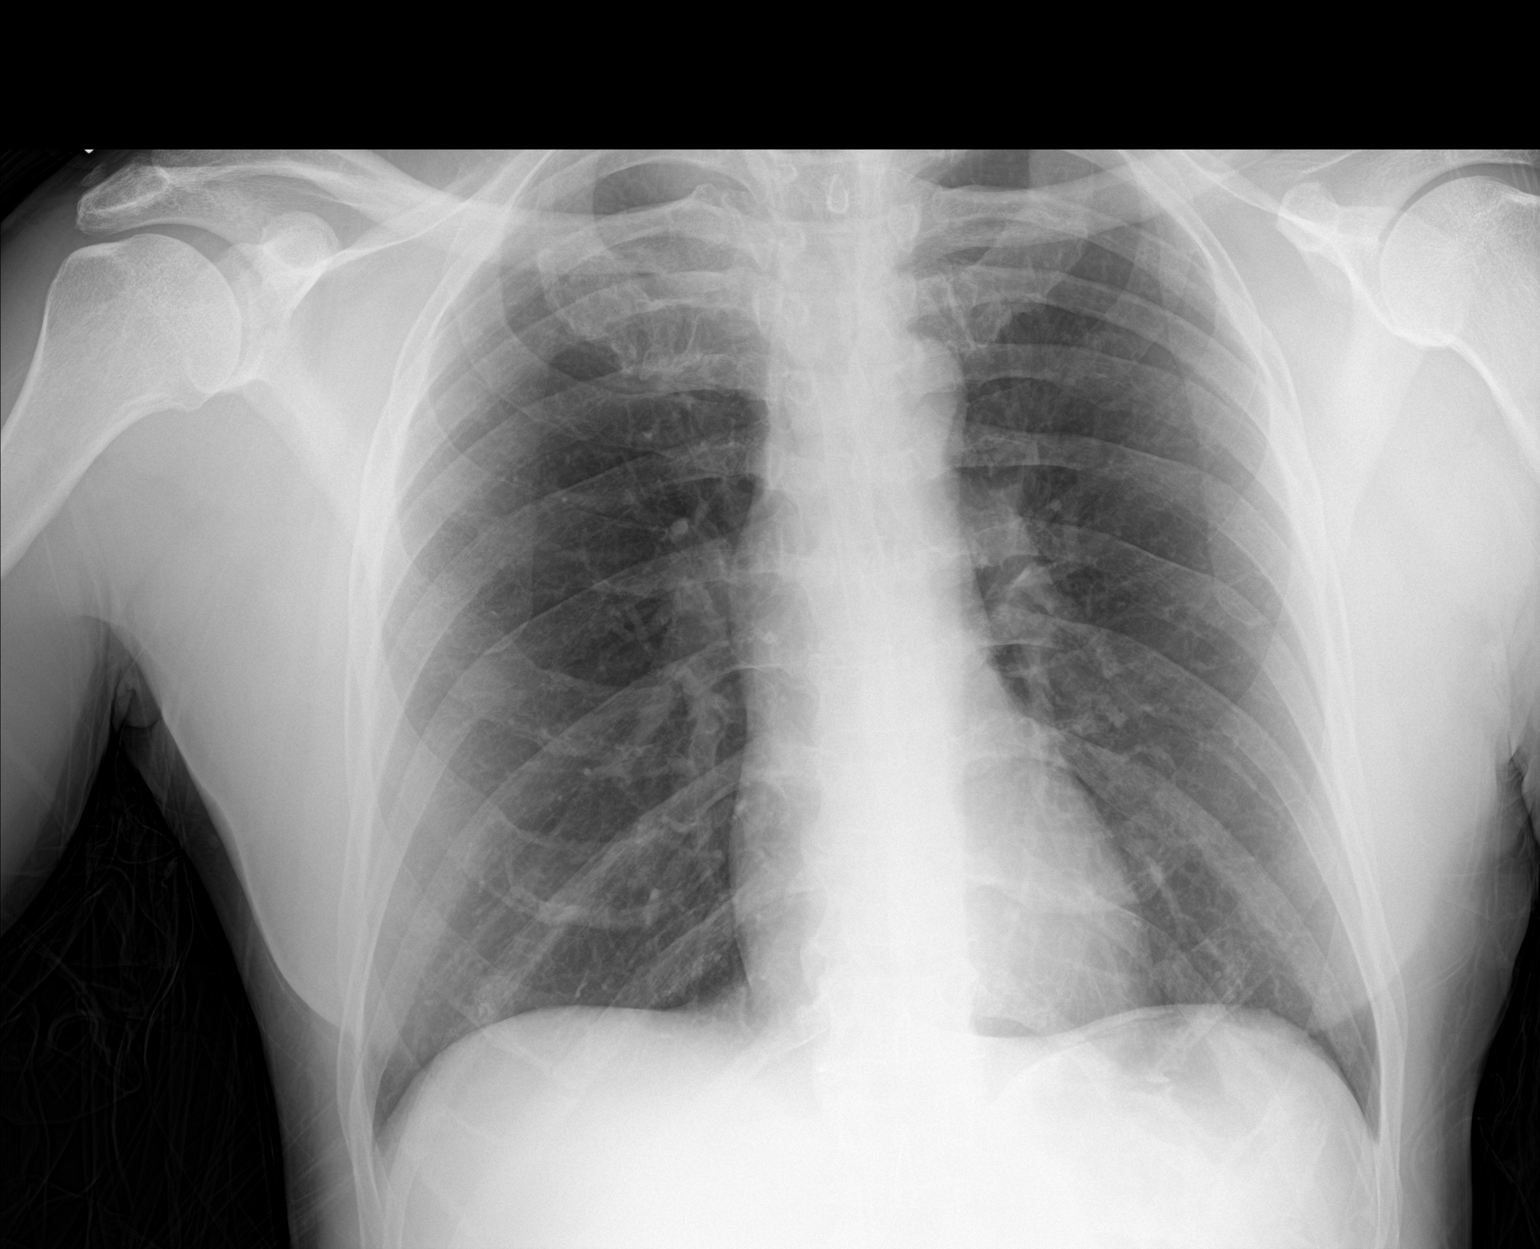

[1 of 1 positions shown; findings below may reference images not displayed]

FINDINGS: Chronic bronchitic changes and hyperinflation are similar to prior
exam. No consolidation, features of edema, pneumothorax, or
effusion. The cardiomediastinal contours are unremarkable. No acute
osseous or soft tissue abnormality. Degenerative changes are present
in the imaged spine and shoulders.
IMPRESSION: Chronic bronchitic changes and hyperinflation. No acute
cardiopulmonary findings.
